# Patient Record
Sex: Female | Born: 1995 | Race: Black or African American | Hispanic: No | Marital: Single | Smoking: Never smoker
Health system: Southern US, Community
[De-identification: ages and names within clinical notes are randomized; demographics above are authoritative.]

## PROBLEM LIST (undated history)

## (undated) DIAGNOSIS — J45909 Unspecified asthma, uncomplicated: Secondary | ICD-10-CM

## (undated) DIAGNOSIS — E039 Hypothyroidism, unspecified: Secondary | ICD-10-CM

---

## 2016-07-25 ENCOUNTER — Emergency Department (HOSPITAL_COMMUNITY)
Admission: EM | Admit: 2016-07-25 | Discharge: 2016-07-25 | Disposition: A | Discharge status: Home / Self Care | Payer: BLUE CROSS/BLUE SHIELD | Attending: Emergency Medicine | Admitting: Emergency Medicine

## 2016-07-25 ENCOUNTER — Encounter (HOSPITAL_COMMUNITY): Payer: Self-pay | Admitting: Emergency Medicine

## 2016-07-25 ENCOUNTER — Emergency Department (HOSPITAL_COMMUNITY): Payer: BLUE CROSS/BLUE SHIELD

## 2016-07-25 DIAGNOSIS — J45909 Unspecified asthma, uncomplicated: Secondary | ICD-10-CM | POA: Insufficient documentation

## 2016-07-25 DIAGNOSIS — M545 Low back pain: Secondary | ICD-10-CM | POA: Diagnosis present

## 2016-07-25 DIAGNOSIS — M5442 Lumbago with sciatica, left side: Secondary | ICD-10-CM | POA: Diagnosis not present

## 2016-07-25 DIAGNOSIS — M5432 Sciatica, left side: Secondary | ICD-10-CM

## 2016-07-25 HISTORY — DX: Unspecified asthma, uncomplicated: J45.909

## 2016-07-25 LAB — POC URINE PREG, ED: PREG TEST UR: NEGATIVE

## 2016-07-25 MED ORDER — CYCLOBENZAPRINE HCL 10 MG PO TABS: 10.0000 mg | ORAL_TABLET | Freq: Two times a day (BID) | ORAL | 0 refills | Status: AC | PRN

## 2016-07-25 MED ORDER — DICLOFENAC SODIUM 50 MG PO TBEC: 50.0000 mg | DELAYED_RELEASE_TABLET | Freq: Two times a day (BID) | ORAL | 0 refills | Status: AC

## 2016-07-25 MED ORDER — CYCLOBENZAPRINE HCL 10 MG PO TABS
10.0000 mg | ORAL_TABLET | Freq: Once | ORAL | Status: AC
  Administered 2016-07-25: 10 mg via ORAL
  Filled 2016-07-25: qty 1

## 2016-07-25 NOTE — ED Triage Notes (Signed)
C/o pain in tailbone area/low back pain-- started a week ago, c/o hurts to sit-- low back pain with bending over--

## 2016-07-25 NOTE — ED Notes (Signed)
Declined W/C at D/C and was escorted to lobby by RN. 

## 2016-07-25 NOTE — Discharge Instructions (Signed)
Do not take the muscle relaxant if driving as it will make you sleepy. Follow up with the orthopedic doctor if symptoms persist.

## 2016-07-25 NOTE — ED Provider Notes (Signed)
[facility]-EMERGENCY DEPT Provider Note   CSN: [id number] Arrival date & time: 07/25/16  1125     History   Chief Complaint Chief Complaint  Patient presents with  . Tailbone Pain  . Back Pain    HPI Wanda Norris is a 20 y.o. female who presents to the ED with pain in the lower back "near my tailbone". Pain started a week ago. Pain increases with sitting and bending over. No loss of control of bladder or bowels. No UTI symptoms.   HPI  Past Medical History:  Diagnosis Date  . Asthma     There are no active problems to display for this patient.   History reviewed. No pertinent surgical history.  OB History    No data available       Home Medications    Prior to Admission medications   Medication Sig Start Date End Date Taking? Authorizing Provider  cyclobenzaprine (FLEXERIL) 10 MG tablet Take 1 tablet (10 mg total) by mouth 2 (two) times daily as needed for muscle spasms. 07/25/16   Areal Cochrane Orlene OchM Bonifacio Pruden, NP  diclofenac (VOLTAREN) 50 MG EC tablet Take 1 tablet (50 mg total) by mouth 2 (two) times daily. 07/25/16   Raye Slyter Orlene OchM Asuna Peth, NP    Family History No family history on file.  Social History Social History  Substance Use Topics  . Smoking status: Never Smoker  . Smokeless tobacco: Never Used  . Alcohol use No     Allergies   Review of patient's allergies indicates no known allergies.   Review of Systems Review of Systems Negative except as stated in HPI  Physical Exam Updated Vital Signs BP 132/68   Pulse 63   Temp 98 F (36.7 C)   Resp 16   Ht 5\' 7"  (1.702 m)   Wt 98 kg   LMP 07/25/2016   SpO2 100%   BMI 33.83 kg/m   Physical Exam  Constitutional: She is oriented to person, place, and time. She appears well-developed and well-nourished. No distress.  HENT:  Head: Normocephalic and atraumatic.  Right Ear: Tympanic membrane normal.  Left Ear: Tympanic membrane normal.  Nose: Nose normal.  Mouth/Throat: Uvula is midline, oropharynx is clear and  moist and mucous membranes are normal.  Eyes: EOM are normal.  Neck: Normal range of motion. Neck supple.  Cardiovascular: Normal rate and regular rhythm.   Pulmonary/Chest: Effort normal. She has no wheezes. She has no rales.  Abdominal: Soft. Bowel sounds are normal. There is no tenderness.  Musculoskeletal: Normal range of motion.       Lumbar back: She exhibits tenderness, pain and spasm. She exhibits normal pulse.  Neurological: She is alert and oriented to person, place, and time. She has normal strength. No cranial nerve deficit or sensory deficit. Gait normal.  Reflex Scores:      Bicep reflexes are 2+ on the right side and 2+ on the left side.      Brachioradialis reflexes are 2+ on the right side and 2+ on the left side.      Patellar reflexes are 2+ on the right side and 2+ on the left side.      Achilles reflexes are 2+ on the right side and 2+ on the left side. Skin: Skin is warm and dry.  Psychiatric: She has a normal mood and affect. Her behavior is normal.  Nursing note and vitals reviewed.    ED Treatments / Results  Labs (all labs ordered are listed, but only abnormal results  are displayed) Labs Reviewed  POC URINE PREG, ED   Radiology Dg Lumbar Spine Complete  Result Date: 07/25/2016 CLINICAL DATA:  Generalize low back pain radiating down the left leg over the last day. EXAM: LUMBAR SPINE - COMPLETE 4+ VIEW COMPARISON:  None. FINDINGS: Five lumbar type vertebral bodies show curvature convex to the left. Disc heights are within normal limits. No facet arthropathy or pars defect. No other focal finding. IMPRESSION: Mild curvature convex to the left.  No other abnormal finding. Electronically Signed   By: Paulina Fusi M.D.   On: 07/25/2016 15:07    Procedures Procedures (including critical care time)  Medications Ordered in ED Medications  cyclobenzaprine (FLEXERIL) tablet 10 mg (10 mg Oral Given 07/25/16 1402)     Initial Impression / Assessment and Plan /  ED Course  I have reviewed the triage vital signs and the nursing notes.  Pertinent imaging results that were available during my care of the patient were reviewed by me and considered in my medical decision making (see chart for details).  Clinical Course    Final Clinical Impressions(s) / ED Diagnoses  20 y.o. female with low back pain that radiates to the left buttock stable for d/c without neuro deficits. Will treat with muscle relaxants and NSAIDS. Patient to f/u with PCP or return here as needed. Final diagnoses:  Sciatica, left side    New Prescriptions Discharge Medication List as of 07/25/2016  3:35 PM    START taking these medications   Details  cyclobenzaprine (FLEXERIL) 10 MG tablet Take 1 tablet (10 mg total) by mouth 2 (two) times daily as needed for muscle spasms., Starting Wed 07/25/2016, Print    diclofenac (VOLTAREN) 50 MG EC tablet Take 1 tablet (50 mg total) by mouth 2 (two) times daily., Starting Wed 07/25/2016, Print         [place], NP 07/26/16 2137    Benjiman Core, MD 07/27/16 [phone]

## 2016-12-07 ENCOUNTER — Emergency Department (HOSPITAL_COMMUNITY)
Admission: EM | Admit: 2016-12-07 | Discharge: 2016-12-07 | Disposition: A | Discharge status: Home / Self Care | Payer: BLUE CROSS/BLUE SHIELD | Attending: Emergency Medicine | Admitting: Emergency Medicine

## 2016-12-07 ENCOUNTER — Encounter (HOSPITAL_COMMUNITY): Payer: Self-pay

## 2016-12-07 DIAGNOSIS — M791 Myalgia, unspecified site: Secondary | ICD-10-CM

## 2016-12-07 DIAGNOSIS — E039 Hypothyroidism, unspecified: Secondary | ICD-10-CM | POA: Insufficient documentation

## 2016-12-07 DIAGNOSIS — J45909 Unspecified asthma, uncomplicated: Secondary | ICD-10-CM | POA: Insufficient documentation

## 2016-12-07 HISTORY — DX: Hypothyroidism, unspecified: E03.9

## 2016-12-07 LAB — CBC WITH DIFFERENTIAL/PLATELET
BASOS PCT: 0 %
Basophils Absolute: 0 10*3/uL (ref 0.0–0.1)
EOS ABS: 0 10*3/uL (ref 0.0–0.7)
EOS PCT: 0 %
HEMATOCRIT: 33.4 % — AB (ref 36.0–46.0)
Hemoglobin: 11 g/dL — ABNORMAL LOW (ref 12.0–15.0)
Lymphocytes Relative: 22 %
Lymphs Abs: 2.6 10*3/uL (ref 0.7–4.0)
MCH: 27 pg (ref 26.0–34.0)
MCHC: 32.9 g/dL (ref 30.0–36.0)
MCV: 82.1 fL (ref 78.0–100.0)
MONO ABS: 0.7 10*3/uL (ref 0.1–1.0)
MONOS PCT: 6 %
NEUTROS ABS: 8.2 10*3/uL — AB (ref 1.7–7.7)
Neutrophils Relative %: 72 %
PLATELETS: 278 10*3/uL (ref 150–400)
RBC: 4.07 MIL/uL (ref 3.87–5.11)
RDW: 13.9 % (ref 11.5–15.5)
WBC: 11.5 10*3/uL — ABNORMAL HIGH (ref 4.0–10.5)

## 2016-12-07 LAB — CK: Total CK: 167 U/L (ref 38–234)

## 2016-12-07 LAB — COMPREHENSIVE METABOLIC PANEL
ALBUMIN: 3.5 g/dL (ref 3.5–5.0)
ALT: 27 U/L (ref 14–54)
ANION GAP: 11 (ref 5–15)
AST: 20 U/L (ref 15–41)
Alkaline Phosphatase: 47 U/L (ref 38–126)
BILIRUBIN TOTAL: 0.5 mg/dL (ref 0.3–1.2)
BUN: 8 mg/dL (ref 6–20)
CHLORIDE: 104 mmol/L (ref 101–111)
CO2: 24 mmol/L (ref 22–32)
Calcium: 9.3 mg/dL (ref 8.9–10.3)
Creatinine, Ser: 0.7 mg/dL (ref 0.44–1.00)
GFR calc Af Amer: >60 mL/min (ref >60)
GFR calc non Af Amer: >60 mL/min (ref >60)
GLUCOSE: 82 mg/dL (ref 65–99)
POTASSIUM: 3.6 mmol/L (ref 3.5–5.1)
Sodium: 139 mmol/L (ref 135–145)
TOTAL PROTEIN: 8 g/dL (ref 6.5–8.1)

## 2016-12-07 LAB — TSH: TSH: 1.27 u[IU]/mL (ref 0.350–4.500)

## 2016-12-07 LAB — C-REACTIVE PROTEIN

## 2016-12-07 LAB — SEDIMENTATION RATE: SED RATE: 45 mm/h — AB (ref 0–22)

## 2016-12-07 MED ORDER — IBUPROFEN 600 MG PO TABS: 600.0000 mg | ORAL_TABLET | Freq: Three times a day (TID) | ORAL | 0 refills | Status: AC

## 2016-12-07 MED ORDER — METHOCARBAMOL 500 MG PO TABS: 500.0000 mg | ORAL_TABLET | Freq: Every evening | ORAL | 0 refills | Status: AC | PRN

## 2016-12-07 NOTE — ED Notes (Signed)
Pt unable to sign due to computer malfunction. Pt verbalized discharge instructions. 

## 2016-12-07 NOTE — Discharge Instructions (Signed)
Please call primary care doctor on Monday for appointment  Take Ibuprofen 600mg  three times daily for pain Take muscle relaxer at bedtime. This medication makes you sleepy.

## 2016-12-07 NOTE — ED Triage Notes (Signed)
Pt is coming from home with aching pain that started approximately 10 days ago. Pt reports it is a pain that can happen intermittently all over her body, but is currently in her left foot. Pt reports "It seems like it could be a pinched nerve."

## 2016-12-07 NOTE — ED Provider Notes (Signed)
[facility] Provider Note   CSN: [id number] Arrival date & time: 12/07/16  1801   By signing my name below, I, Wanda Norris, attest that this documentation has been prepared under the direction and in the presence of  Wanda HartKelly Aleiyah Halpin, PA-C. Electronically Signed: Clovis PuAvnee Norris, ED Scribe. 12/07/16. 6:59 PM.   History   Chief Complaint Chief Complaint  Patient presents with  . Foot Pain    The history is provided by the patient. No language interpreter was used.   HPI Comments:  Wanda Norris is a 21 y.o. female, with a hx of hypothyroidism, who presents to the Emergency Department complaining of constant generalized body aches onset 13 days. Pt states her aching begins at her bilateral feet and intermittently radiates throughout her body including up to her bilateral upper extremities. She describes her pain as a "deep bone pain" and also reports back pain. Her pain is worse with movement and upon palpation. She notes she was recently put on thyroid medication but stopped taking this medication 6 days ago to see if her symptoms would improve which they did not. Pt has been taking ibuprofen with temporary relief. Pt denies fevers, chills, rhinorrhea, congestion, cough, numbness/tingling, bowel/bladder incontinence, a hx of cancer, a hx of IV drug use, family hx of similar pain, any recent injuries or any other associated symptoms.   Past Medical History:  Diagnosis Date  . Asthma   . Hypothyroidism     There are no active problems to display for this patient.   History reviewed. No pertinent surgical history.  OB History    No data available       Home Medications    Prior to Admission medications   Medication Sig Start Date End Date Taking? Authorizing Provider  cyclobenzaprine (FLEXERIL) 10 MG tablet Take 1 tablet (10 mg total) by mouth 2 (two) times daily as needed for muscle spasms. 07/25/16   Hope Orlene OchM Neese, NP  diclofenac (VOLTAREN) 50 MG EC tablet Take 1 tablet (50  mg total) by mouth 2 (two) times daily. 07/25/16   Hope Orlene OchM Neese, NP    Family History No family history on file.  Social History Social History  Substance Use Topics  . Smoking status: Never Smoker  . Smokeless tobacco: Never Used  . Alcohol use No     Allergies   Patient has no known allergies.   Review of Systems Review of Systems  Constitutional: Negative for chills and fever.  HENT: Negative for congestion and rhinorrhea.   Respiratory: Negative for cough.   Musculoskeletal: Positive for back pain and myalgias.  Neurological: Negative for numbness.   Physical Exam Updated Vital Signs BP 128/73 (BP Location: Left Arm)   Pulse 86   Temp 98.1 F (36.7 C) (Oral)   Resp 18   Ht 5\' 6"  (1.676 m)   Wt 274 lb (124.3 kg)   SpO2 100%   BMI 44.22 kg/m   Physical Exam  Constitutional: She is oriented to person, place, and time. She appears well-developed and well-nourished. No distress.  Pleasant, NAD, overweight  HENT:  Head: Normocephalic and atraumatic.  Eyes: Conjunctivae are normal.  Cardiovascular: Normal rate.   Pulmonary/Chest: Effort normal.  Abdominal: She exhibits no distension.  Musculoskeletal:  Inspection: No masses, deformity, or rash Palpation: Diffuse back tenderness. No focal midline tenderness Strength: 5/5 in lower extremities and normal plantar and dorsiflexion Sensation: Intact sensation with light touch in lower extremities bilaterally Reflexes: Patellar reflex is 2+ bilaterally SLR: Negative seated  straight leg raise Gait: Normal gait   Neurological: She is alert and oriented to person, place, and time.  Strength 5/5 in upper and lower extremities  Skin: Skin is warm and dry.  Psychiatric: She has a normal mood and affect.  Nursing note and vitals reviewed.    ED Treatments / Results  DIAGNOSTIC STUDIES:  Oxygen Saturation is 100% on RA, normal by my interpretation.    COORDINATION OF CARE:  6:59 PM Discussed treatment plan with  pt at bedside and pt agreed to plan.  Labs (all labs ordered are listed, but only abnormal results are displayed) Labs Reviewed  CBC WITH DIFFERENTIAL/PLATELET - Abnormal; Notable for the following:       Result Value   WBC 11.5 (*)    Hemoglobin 11.0 (*)    HCT 33.4 (*)    Neutro Abs 8.2 (*)    All other components within normal limits  SEDIMENTATION RATE - Abnormal; Notable for the following:    Sed Rate 45 (*)    All other components within normal limits  COMPREHENSIVE METABOLIC PANEL  TSH  CK  C-REACTIVE PROTEIN    EKG  EKG Interpretation None       Radiology No results found.  Procedures Procedures (including critical care time)  Medications Ordered in ED Medications - No data to display   Initial Impression / Assessment and Plan / ED Course  I have reviewed the triage vital signs and the nursing notes.  Pertinent labs & imaging results that were available during my care of the patient were reviewed by me and considered in my medical decision making (see chart for details).  21 year old female with atypical back pain. She has diffuse myalgias but overall looks very comfortable. Her vitals are normal. Xray from 07/2016 was normal. CBC remarkable for mild leukocytosis and mild anemia. CMP normal. CK normal. CRP normal. TSH normal. Sed rate mildly elevated to 45. Will start scheduled NSAIDs and muscle relaxer. She currently does not have PCP. Discussed with patient and mother than she will need primary care doctor to follow up with. They verbalized understanding. Return precautions given.   Final Clinical Impressions(s) / ED Diagnoses   Final diagnoses:  Myalgia    New Prescriptions New Prescriptions   No medications on file   I personally performed the services described in this documentation, which was scribed in my presence. The recorded information has been reviewed and is accurate.     Bethel Born, PA-C 12/09/16 1626    Alvira Monday,  MD 12/11/16 1134

## 2018-04-01 IMAGING — DX DG LUMBAR SPINE COMPLETE 4+V
5 series · 5 of 5 positions shown · non-contrast
Comparison: None.

CLINICAL DATA: Generalize low back pain radiating down the left leg
over the last day.

EXAM:
LUMBAR SPINE - COMPLETE 4+ VIEW

[l-spine ap]
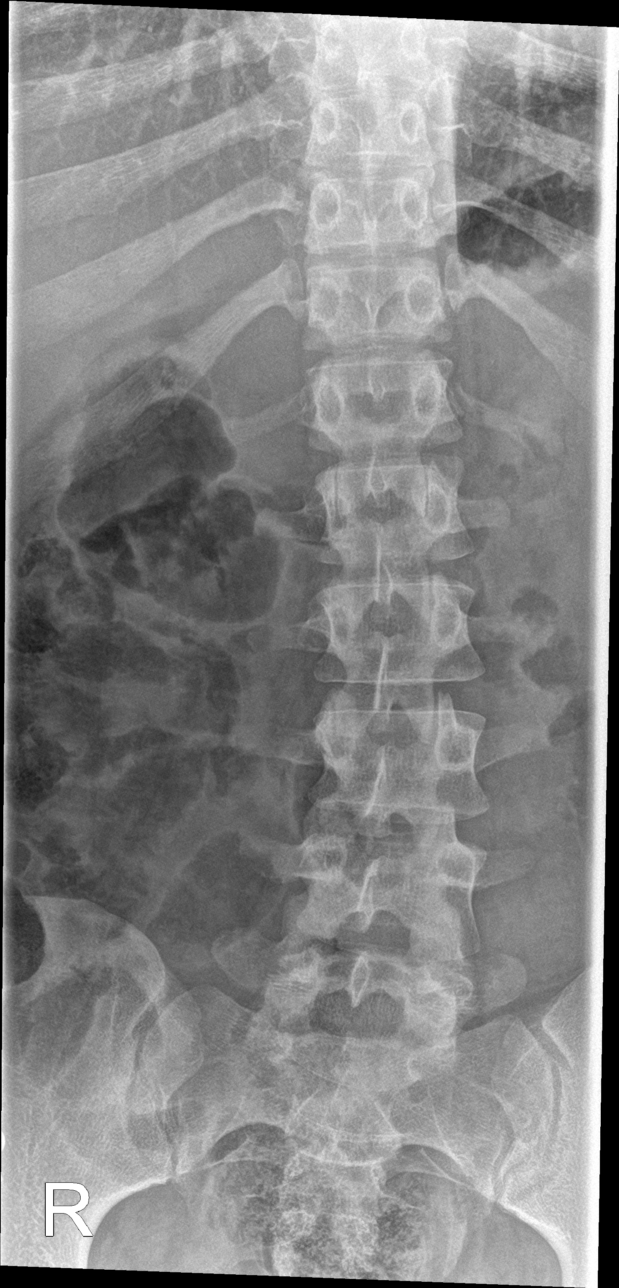

[l-spine obl (1 of 2)]
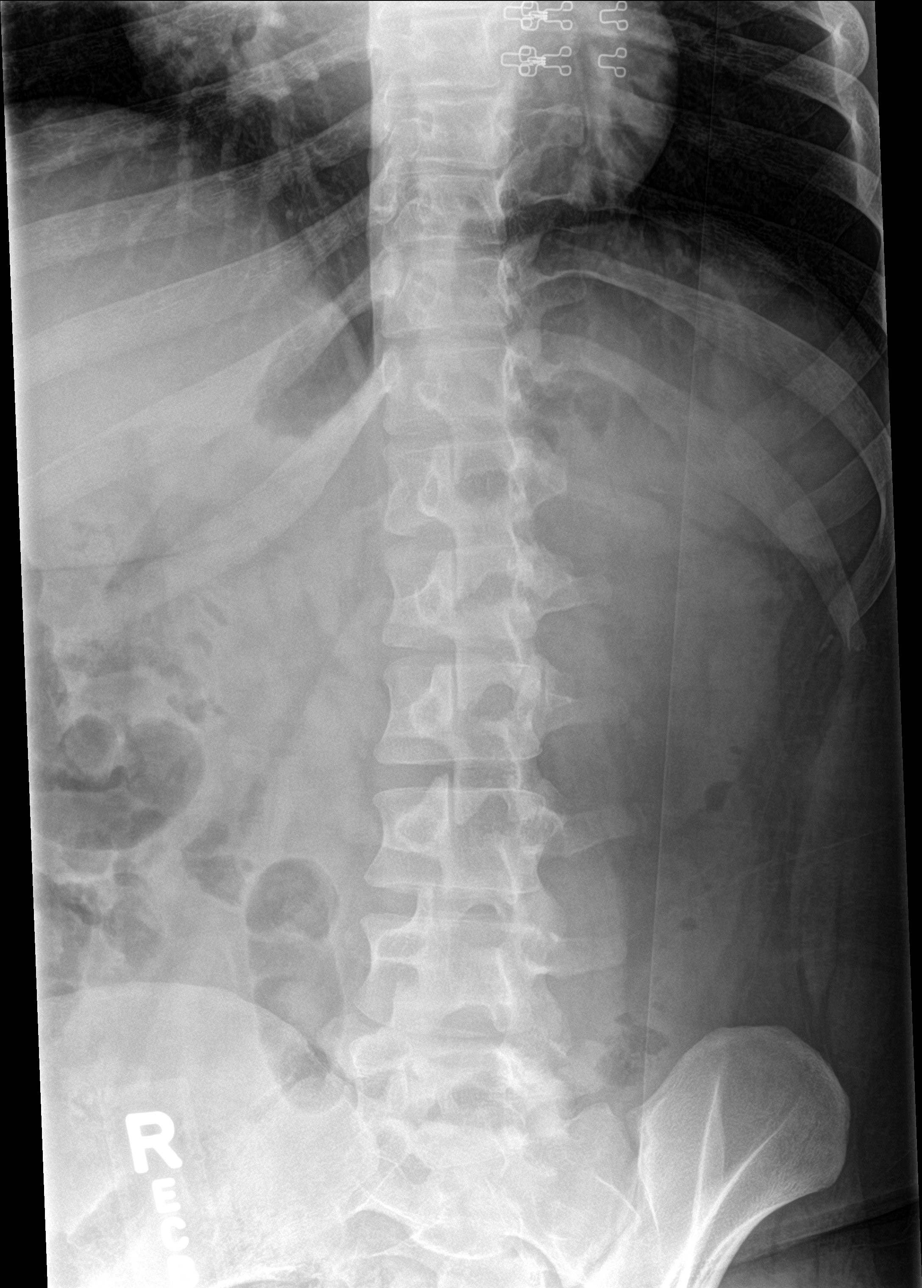

[l-spine obl (2 of 2)]
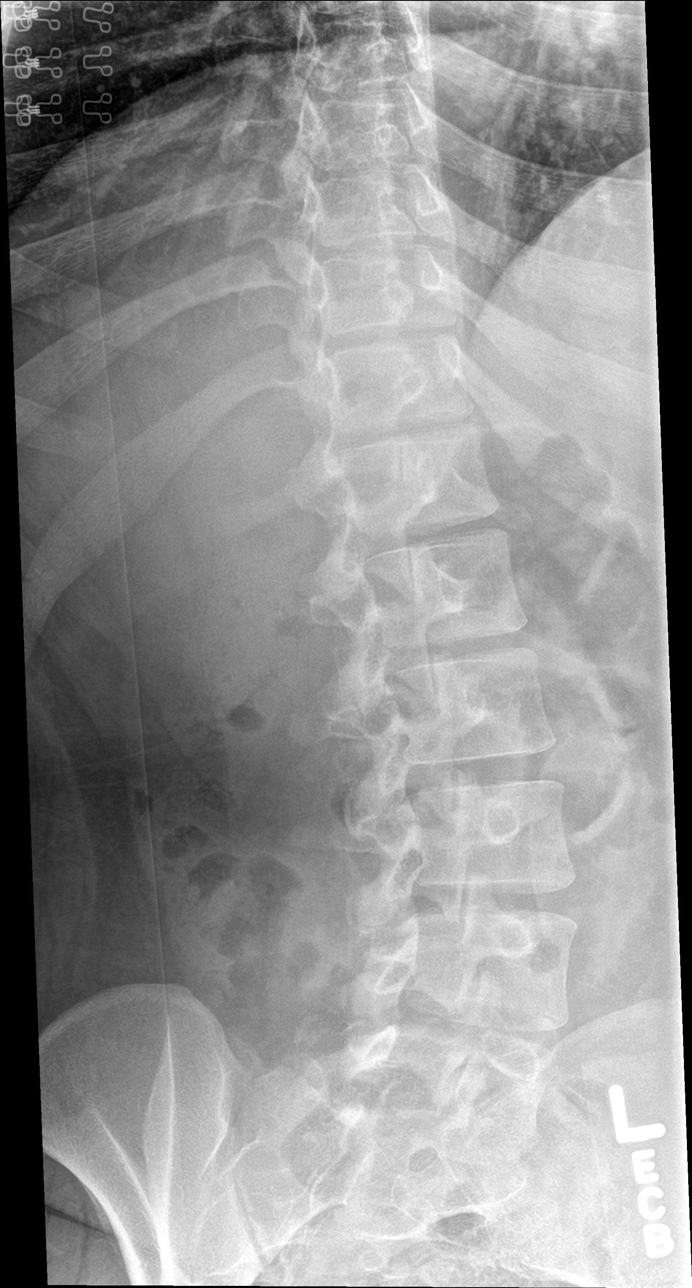

[l-spine lat]
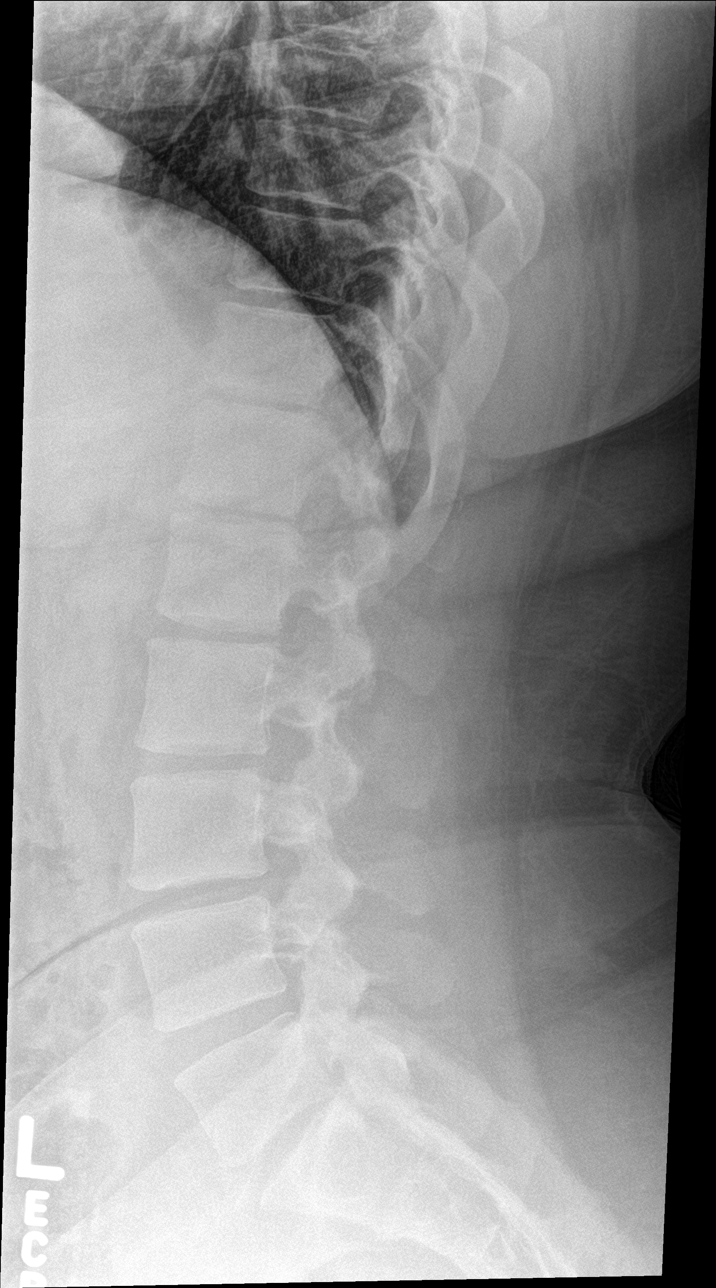

[l-spine spot]
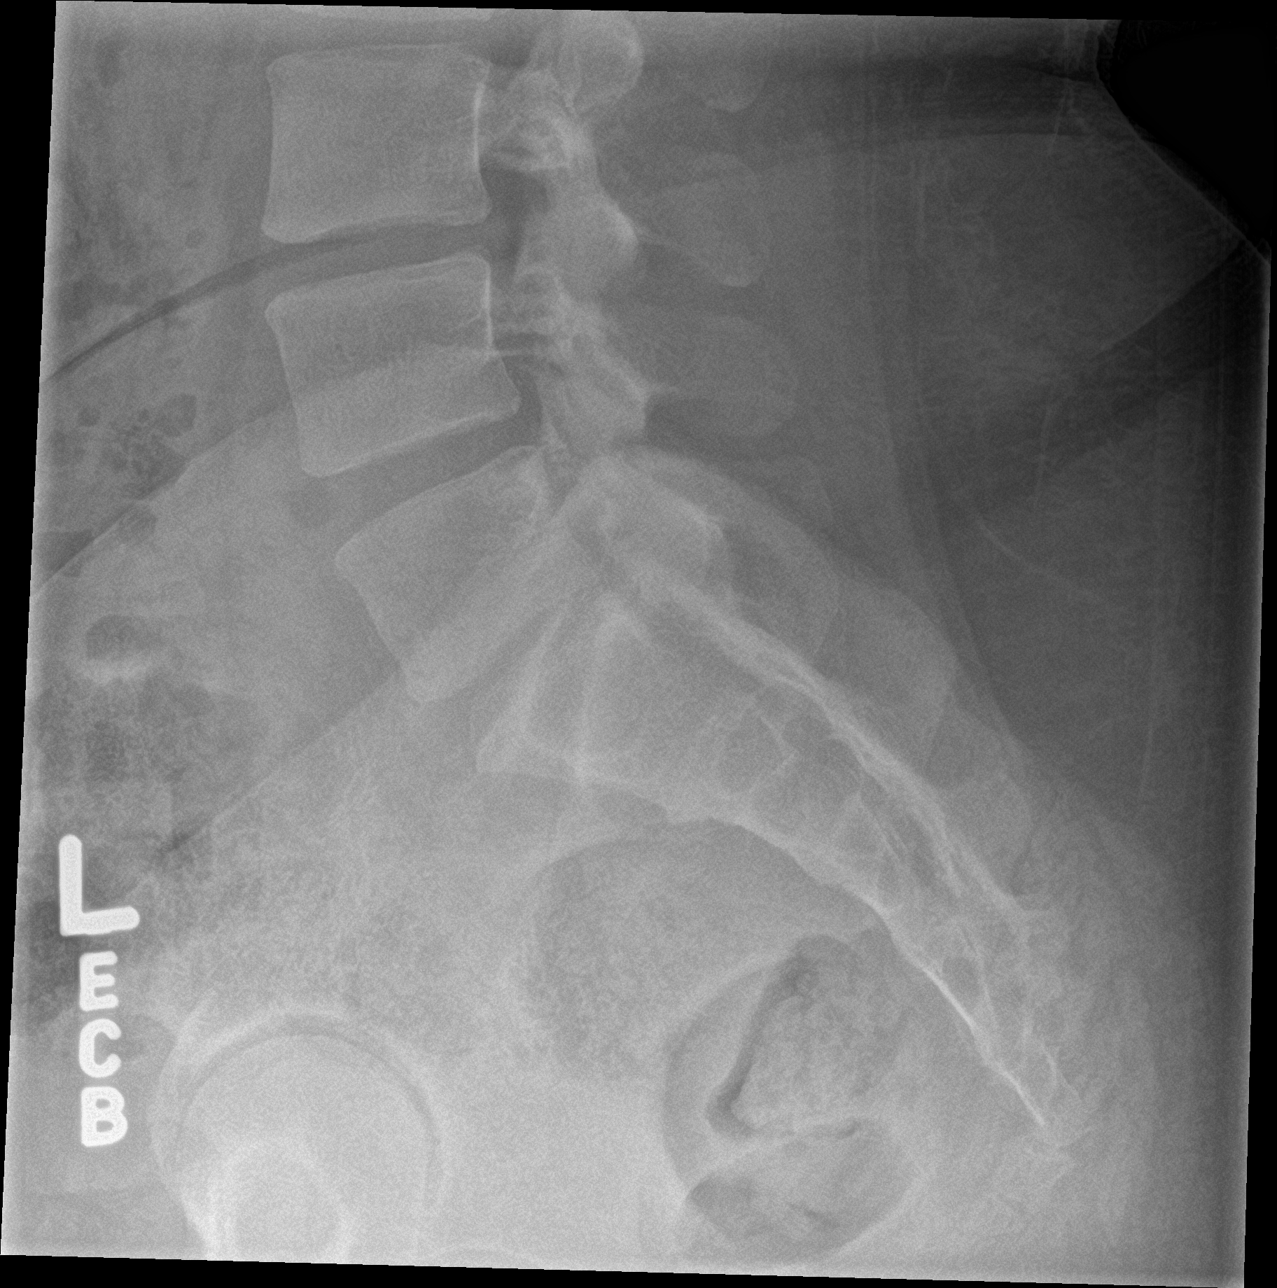

[5 of 5 positions shown; findings below may reference images not displayed]

FINDINGS: Five lumbar type vertebral bodies show curvature convex to the left.
Disc heights are within normal limits. No facet arthropathy or pars
defect. No other focal finding.
IMPRESSION: Mild curvature convex to the left.  No other abnormal finding.
